# Patient Record
Sex: Female | Born: 1990 | Race: White | Hispanic: No | Marital: Single | Smoking: Never smoker
Health system: Southern US, Community
[De-identification: ages and names within clinical notes are randomized; demographics above are authoritative.]

## PROBLEM LIST (undated history)

## (undated) DIAGNOSIS — R55 Syncope and collapse: Secondary | ICD-10-CM

## (undated) DIAGNOSIS — J45909 Unspecified asthma, uncomplicated: Secondary | ICD-10-CM

## (undated) DIAGNOSIS — F32A Depression, unspecified: Secondary | ICD-10-CM

## (undated) DIAGNOSIS — F329 Major depressive disorder, single episode, unspecified: Secondary | ICD-10-CM

## (undated) HISTORY — PX: WISDOM TOOTH EXTRACTION: SHX21

---
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
## 2012-01-04 ENCOUNTER — Ambulatory Visit: Payer: Self-pay | Admitting: Pediatrics

## 2012-09-15 ENCOUNTER — Ambulatory Visit: Payer: Self-pay

## 2013-11-15 ENCOUNTER — Ambulatory Visit: Payer: Self-pay | Admitting: Physician Assistant

## 2014-10-09 ENCOUNTER — Encounter: Payer: Self-pay | Admitting: Emergency Medicine

## 2014-10-09 ENCOUNTER — Ambulatory Visit
Admission: EM | Admit: 2014-10-09 | Discharge: 2014-10-09 | Disposition: A | Discharge status: Home / Self Care | Payer: 59 | Attending: Family Medicine | Admitting: Family Medicine

## 2014-10-09 ENCOUNTER — Ambulatory Visit: Payer: 59

## 2014-10-09 DIAGNOSIS — J45909 Unspecified asthma, uncomplicated: Secondary | ICD-10-CM | POA: Insufficient documentation

## 2014-10-09 DIAGNOSIS — M25571 Pain in right ankle and joints of right foot: Secondary | ICD-10-CM | POA: Diagnosis present

## 2014-10-09 DIAGNOSIS — F329 Major depressive disorder, single episode, unspecified: Secondary | ICD-10-CM | POA: Diagnosis not present

## 2014-10-09 DIAGNOSIS — W19XXXA Unspecified fall, initial encounter: Secondary | ICD-10-CM | POA: Diagnosis not present

## 2014-10-09 DIAGNOSIS — S93401A Sprain of unspecified ligament of right ankle, initial encounter: Secondary | ICD-10-CM | POA: Insufficient documentation

## 2014-10-09 HISTORY — DX: Major depressive disorder, single episode, unspecified: F32.9

## 2014-10-09 HISTORY — DX: Unspecified asthma, uncomplicated: J45.909

## 2014-10-09 HISTORY — DX: Depression, unspecified: F32.A

## 2014-10-09 HISTORY — DX: Syncope and collapse: R55

## 2014-10-09 MED ORDER — ACETAMINOPHEN-CODEINE #3 300-30 MG PO TABS: 1.0000 | ORAL_TABLET | Freq: Four times a day (QID) | ORAL | Status: AC | PRN

## 2014-10-09 NOTE — ED Provider Notes (Signed)
CSN: [id number]     Arrival date & time 10/09/14  0945 History   None    Chief Complaint  Patient presents with  . Ankle Pain   (Consider location/radiation/quality/duration/timing/severity/associated sxs/prior Treatment) HPI Comments: 24 yo female fell yesterday twisting, injuring her right ankle. Complains of pain and swelling. Denies numbness/tingling.  Otherwise healthy.   The history is provided by the patient.    Past Medical History  Diagnosis Date  . Syncope   . Asthma   . Mental depression    Past Surgical History  Procedure Laterality Date  . Wisdom tooth extraction     History reviewed. No pertinent family history. Social History  Substance Use Topics  . Smoking status: Never Smoker   . Smokeless tobacco: None  . Alcohol Use: 0.6 oz/week    1 Glasses of wine per week   OB History    No data available     Review of Systems  Allergies  Review of patient's allergies indicates no known allergies.  Home Medications   Prior to Admission medications   Medication Sig Start Date End Date Taking? Authorizing Provider  albuterol (PROVENTIL) (2.5 MG/3ML) 0.083% nebulizer solution Take 2.5 mg by nebulization every 6 (six) hours as needed for wheezing or shortness of breath.   Yes Historical Provider, MD  etonogestrel (NEXPLANON) 68 MG IMPL implant 1 each by Subdermal route once.   Yes Historical Provider, MD  lamoTRIgine (LAMICTAL) 25 MG tablet Take 50 mg by mouth daily.   Yes Historical Provider, MD  acetaminophen-codeine (TYLENOL #3) 300-30 MG per tablet Take 1-2 tablets by mouth every 6 (six) hours as needed for moderate pain. 10/09/14   Payton Mccallum, MD   BP 135/100 mmHg  Pulse 87  Temp(Src) 98.3 F (36.8 C) (Tympanic)  Resp 16  Ht  (1.575 m)  Wt 125 lb (56.7 kg)  BMI 22.86 kg/m2  SpO2 99% Physical Exam  Constitutional: She appears well-developed and well-nourished. No distress.  Musculoskeletal:       Right ankle: She exhibits swelling. She  exhibits normal range of motion, no ecchymosis, no deformity, no laceration and normal pulse. Tenderness. Lateral malleolus and medial malleolus tenderness found.  Neurological: She is alert.  Skin: She is not diaphoretic.  Nursing note and vitals reviewed.   ED Course  Procedures (including critical care time) Labs Review Labs Reviewed - No data to display  Imaging Review Dg Ankle Complete Right  10/09/2014   CLINICAL DATA:  Pain and swelling of the right ankle after twisting injury  EXAM: RIGHT ANKLE - COMPLETE 3+ VIEW  COMPARISON:  None.  FINDINGS: The ankle joint appears normal. Alignment is normal. No fracture is seen.  IMPRESSION: Negative.   Electronically Signed   By: Dwyane Dee M.D.   On: 10/09/2014 11:16     MDM   1. Ankle sprain, right, initial encounter    Discharge Medication List as of 10/09/2014 11:42 AM    START taking these medications   Details  acetaminophen-codeine (TYLENOL #3) 300-30 MG per tablet Take 1-2 tablets by mouth every 6 (six) hours as needed for moderate pain., Starting 10/09/2014, Until Discontinued, Print      Plan: 1. x-ray results and diagnosis reviewed with patient 2. rx as per orders; risks, benefits, potential side effects reviewed with patient 3. Recommend supportive treatment with rest, ice, elevation 4. F/u prn if symptoms worsen or don't improve    Payton Mccallum, MD 10/10/14 [phone]

## 2014-10-09 NOTE — ED Notes (Signed)
Right ankle pain after twisting last pm

## 2016-11-11 IMAGING — CR DG ANKLE COMPLETE 3+V*R*
3 series · 3 of 3 positions shown · non-contrast
Comparison: None.

CLINICAL DATA: Pain and swelling of the right ankle after twisting
injury

EXAM:
RIGHT ANKLE - COMPLETE 3+ VIEW

[ankle ap]
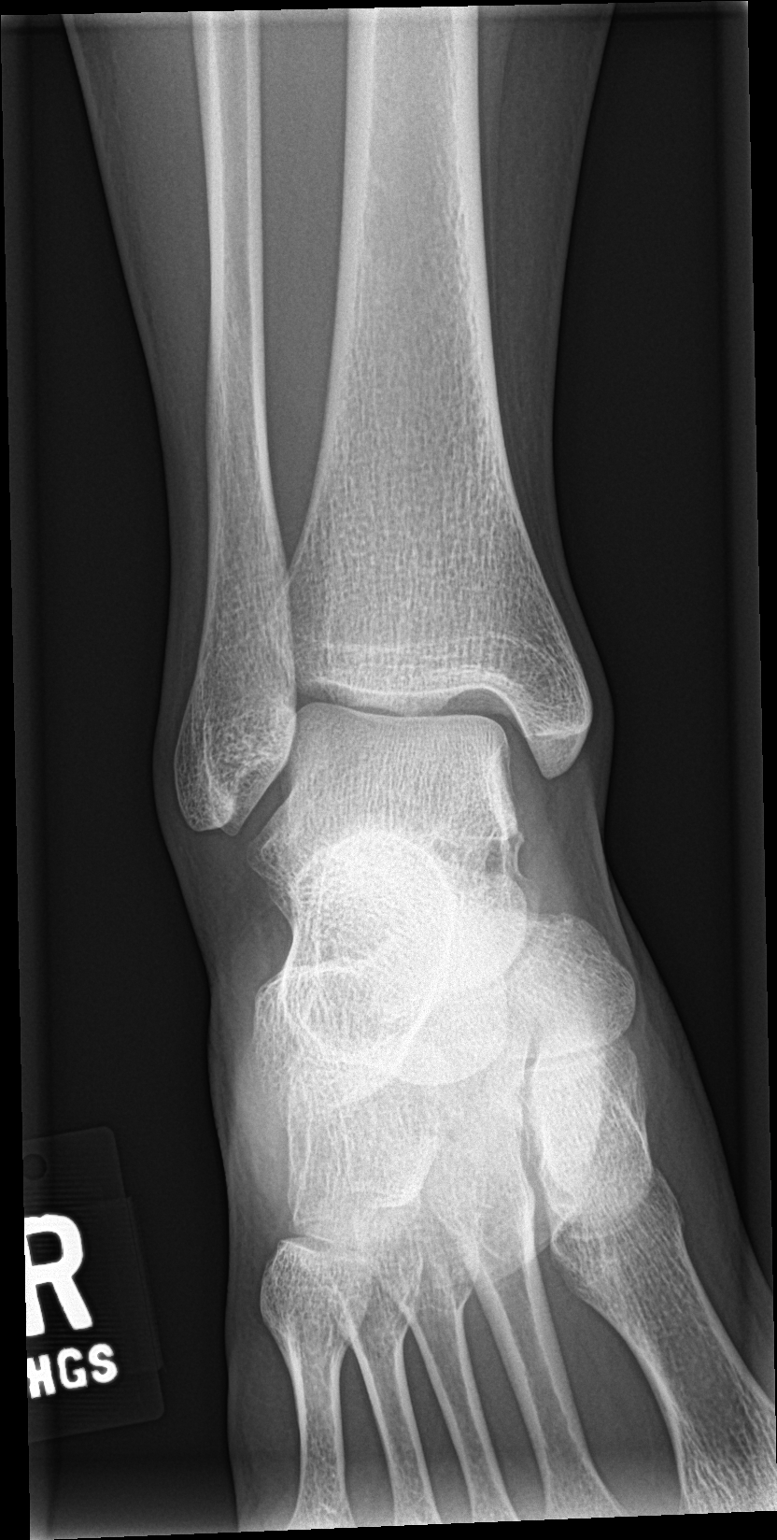

[ankle obl]
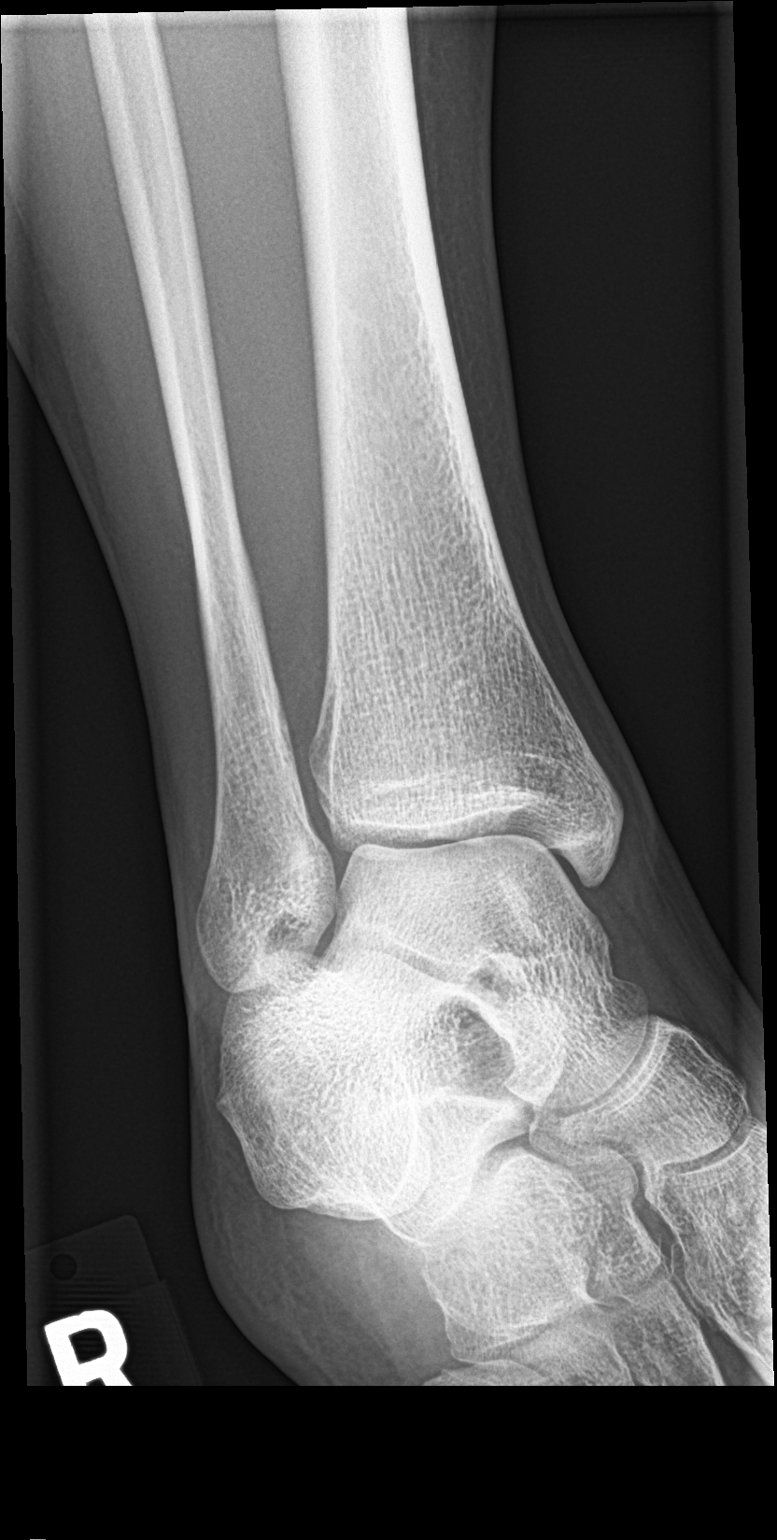

[ankle lat]
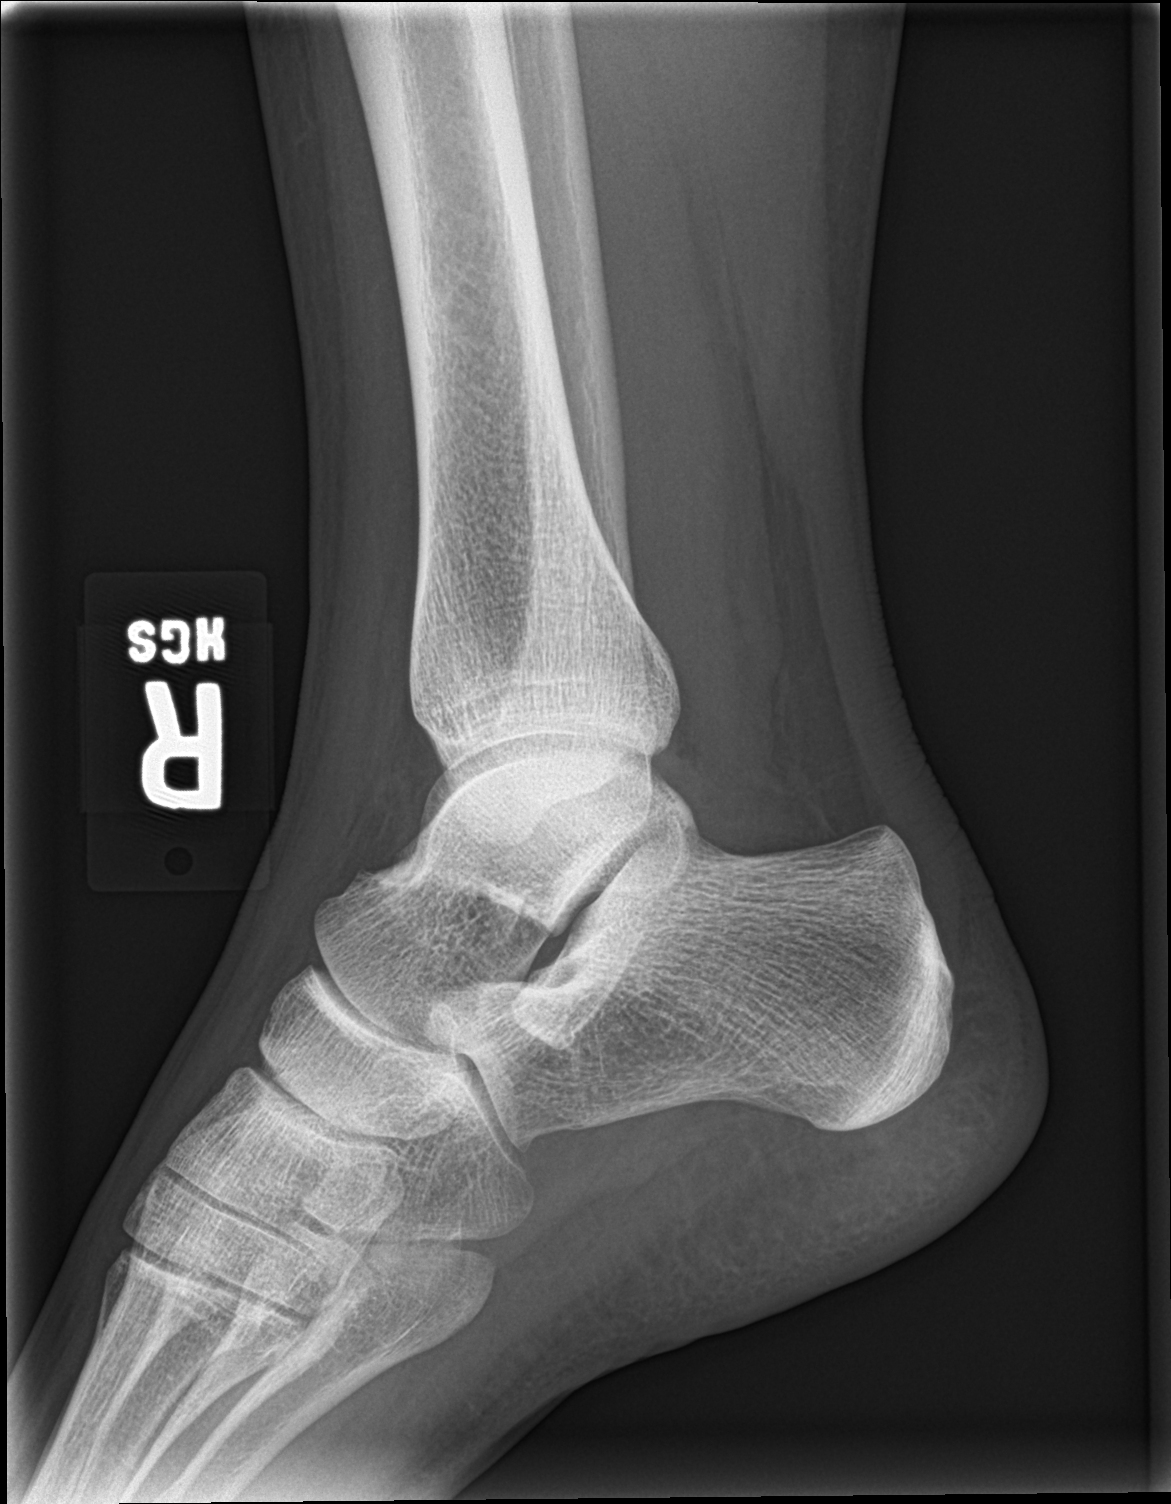

[3 of 3 positions shown; findings below may reference images not displayed]

FINDINGS: The ankle joint appears normal. Alignment is normal. No fracture is
seen.
IMPRESSION: Negative.

## 2023-04-20 ENCOUNTER — Ambulatory Visit
Admission: RE | Admit: 2023-04-20 | Discharge: 2023-04-20 | Disposition: A | Discharge status: Home / Self Care | Payer: Self-pay | Source: Ambulatory Visit | Attending: Family Medicine | Admitting: Family Medicine

## 2023-04-20 VITALS — BP 117/76 | HR 79 | Temp 99.1°F | Resp 16

## 2023-04-20 DIAGNOSIS — R051 Acute cough: Secondary | ICD-10-CM | POA: Insufficient documentation

## 2023-04-20 DIAGNOSIS — R490 Dysphonia: Secondary | ICD-10-CM | POA: Insufficient documentation

## 2023-04-20 DIAGNOSIS — J029 Acute pharyngitis, unspecified: Secondary | ICD-10-CM | POA: Diagnosis present

## 2023-04-20 DIAGNOSIS — J069 Acute upper respiratory infection, unspecified: Secondary | ICD-10-CM | POA: Diagnosis not present

## 2023-04-20 LAB — RESP PANEL BY RT-PCR (FLU A&B, COVID) ARPGX2
Influenza A by PCR: NEGATIVE
Influenza B by PCR: NEGATIVE
SARS Coronavirus 2 by RT PCR: NEGATIVE

## 2023-04-20 LAB — GROUP A STREP BY PCR: Group A Strep by PCR: NOT DETECTED

## 2023-04-20 MED ORDER — PROMETHAZINE-DM 6.25-15 MG/5ML PO SYRP: 5.0000 mL | ORAL_SOLUTION | Freq: Four times a day (QID) | ORAL | 0 refills | Status: AC | PRN

## 2023-04-20 MED ORDER — PREDNISONE 20 MG PO TABS: 40.0000 mg | ORAL_TABLET | Freq: Every day | ORAL | 0 refills | Status: AC

## 2023-04-20 NOTE — Discharge Instructions (Signed)
 -  Negative strep, COVID and flu.  URI/COLD SYMPTOMS: Your exam today is consistent with a viral illness. Antibiotics are not indicated at this time. Use medications as directed, including cough syrup, nasal saline, and decongestants. Your symptoms should improve over the next few days and resolve within 7-10 days. Increase rest and fluids. F/u if symptoms worsen or predominate such as sore throat, ear pain, productive cough, shortness of breath, or if you develop high fevers or worsening fatigue over the next several days.

## 2023-04-20 NOTE — ED Triage Notes (Signed)
 Experiencing sore throat, cough, loss of voice, neck tenderness, and swollen lymph nodes. I experienced these same symptoms approximately 1 week ago, and they went away after a few days of rest and medication. All symptoms returned yesterday. - Entered by patient

## 2023-04-20 NOTE — ED Provider Notes (Signed)
 [facility]    CSN: [id number] Arrival date & time: 04/20/23  0906      History   Chief Complaint Chief Complaint  Patient presents with   Sore Throat    Experiencing sore throat, cough, loss of voice, neck tenderness, and swollen lymph nodes. I experienced these same symptoms approximately 1 week ago, and they went away after a few days of rest and medication. All symptoms returned yesterday. - Entered by patient   Cough   Hoarse    HPI Stephanie Woodard is a 33 y.o. female presenting for sore throat, cough, congestion, voice hoarseness and fatigue x 3-4 days. Reports similar symptoms last week from Monday to Thursday.  States she felt okay part of Thursday, Friday and Saturday and then became ill again with similar symptoms.  Denies fever, chills, sweats, sinus pain, ear pain, chest pain, wheezing or shortness of breath.  No abdominal pain, vomiting or diarrhea.  Reports feeling a little better than she did a couple days ago.  Patient states she became a little concerned because this is a second time symptoms came back in a short amount of time.  History of asthma.  Has not needed to use inhaler recently.  Has been taking DayQuil with some improvement in symptoms.  Has not had any medication today.  HPI  Past Medical History:  Diagnosis Date   Asthma    Mental depression    Syncope     There are no active problems to display for this patient.   Past Surgical History:  Procedure Laterality Date   WISDOM TOOTH EXTRACTION      OB History   No obstetric history on file.      Home Medications    Prior to Admission medications   Medication Sig Start Date End Date Taking? Authorizing Provider  methylphenidate 54 MG PO CR tablet Take 54 mg by mouth daily. 03/08/23  Yes [provider]  predniSONE (DELTASONE) 20 MG tablet Take 2 tablets (40 mg total) by mouth daily for 5 days. 04/20/23 04/25/23 Yes Shirlee Latch, PA-C   promethazine-dextromethorphan (PROMETHAZINE-DM) 6.25-15 MG/5ML syrup Take 5 mLs by mouth 4 (four) times daily as needed. 04/20/23  Yes Eusebio Friendly B, PA-C  WIXELA INHUB 100-50 MCG/ACT AEPB 1 puff 2 (two) times daily. 03/25/23  Yes [provider]  acetaminophen-codeine (TYLENOL #3) 300-30 MG per tablet Take 1-2 tablets by mouth every 6 (six) hours as needed for moderate pain. 10/09/14   Payton Mccallum, MD  albuterol (PROVENTIL) (2.5 MG/3ML) 0.083% nebulizer solution Take 2.5 mg by nebulization every 6 (six) hours as needed for wheezing or shortness of breath.    [provider]  cloNIDine (CATAPRES) 0.1 MG tablet Take 0.1 mg by mouth at bedtime.    [provider]  etonogestrel (NEXPLANON) 68 MG IMPL implant 1 each by Subdermal route once.    [provider]  lamoTRIgine (LAMICTAL) 25 MG tablet Take 50 mg by mouth daily.    [provider]    Family History No family history on file.  Social History Social History   Tobacco Use   Smoking status: Never   Smokeless tobacco: Never  Vaping Use   Vaping status: Never Used  Substance Use Topics   Alcohol use: Yes    Alcohol/week: 1.0 standard drink of alcohol    Types: 1 Glasses of wine per week   Drug use: Never     Allergies   Patient has no known allergies.  Review of Systems Review of Systems  Constitutional:  Positive for fatigue. Negative for chills, diaphoresis and fever.  HENT:  Positive for congestion, rhinorrhea, sore throat and voice change. Negative for ear pain, sinus pressure and sinus pain.   Respiratory:  Positive for cough. Negative for shortness of breath.   Gastrointestinal:  Negative for abdominal pain, nausea and vomiting.  Musculoskeletal:  Positive for neck pain. Negative for arthralgias and myalgias.  Skin:  Negative for rash.  Neurological:  Negative for weakness and headaches.  Hematological:  Positive for adenopathy.     Physical Exam Triage Vital  Signs ED Triage Vitals  Encounter Vitals Group     BP      Systolic BP Percentile      Diastolic BP Percentile      Pulse      Resp      Temp      Temp src      SpO2      Weight      Height      Head Circumference      Peak Flow      Pain Score      Pain Loc      Pain Education      Exclude from Growth Chart    No data found.  Updated Vital Signs BP 117/76 (BP Location: Right Arm)   Pulse 79   Temp 99.1 F (37.3 C) (Oral)   Resp 16   SpO2 97%   Physical Exam Vitals and nursing note reviewed.  Constitutional:      General: She is not in acute distress.    Appearance: Normal appearance. She is well-developed. She is not ill-appearing or toxic-appearing.     Comments: +voice hoarseness  HENT:     Head: Normocephalic and atraumatic.     Right Ear: Tympanic membrane, ear canal and external ear normal.     Left Ear: Tympanic membrane, ear canal and external ear normal.     Nose: Congestion present.     Mouth/Throat:     Mouth: Mucous membranes are moist.     Pharynx: Oropharynx is clear. Posterior oropharyngeal erythema present.  Eyes:     General: No scleral icterus.       Right eye: No discharge.        Left eye: No discharge.     Conjunctiva/sclera: Conjunctivae normal.  Cardiovascular:     Rate and Rhythm: Normal rate and regular rhythm.     Heart sounds: Normal heart sounds.  Pulmonary:     Effort: Pulmonary effort is normal. No respiratory distress.     Breath sounds: Normal breath sounds. No wheezing, rhonchi or rales.  Musculoskeletal:     Cervical back: Neck supple.  Skin:    General: Skin is dry.  Neurological:     General: No focal deficit present.     Mental Status: She is alert. Mental status is at baseline.     Motor: No weakness.     Gait: Gait normal.  Psychiatric:        Mood and Affect: Mood normal.        Behavior: Behavior normal.      UC Treatments / Results  Labs (all labs ordered are listed, but only abnormal results are  displayed) Labs Reviewed  GROUP A STREP BY PCR  RESP PANEL BY RT-PCR (FLU A&B, COVID) [id number]    EKG   Radiology No results found.  Procedures Procedures (including critical care time)  Medications Ordered in UC Medications - No data to display  Initial Impression / Assessment and Plan / UC Course  I have reviewed the triage vital signs and the nursing notes.  Pertinent labs & imaging results that were available during my care of the patient were reviewed by me and considered in my medical decision making (see chart for details).   33 y/o female presents for 3 to 4-day history of voice hoarseness, sore throat, cough, congestion, fatigue.  Reports similar symptoms for about 3 days last week and then felt okay for couple days before symptoms returned.  No associated fever, ear pain, sinus pain, chest pain, shortness of breath.  History of asthma but is not needed use inhaler.  Taking DayQuil.  Vitals are stable and normal and patient is overall well-appearing.  No acute distress.  On exam has a hoarse voice.  Also has nasal congestion, mild posterior pharyngeal erythema.  Chest clear.  Heart regular rate and rhythm.  Strep testing and respiratory panel obtained. All negative.   Viral URI. Reviewed results with patient. Sent prednisone and promethazine DM to pharmacy. Advised returning if fever, breathing issues or worsening symptoms.  Work note given.   Final Clinical Impressions(s) / UC Diagnoses   Final diagnoses:  Viral upper respiratory tract infection  Acute cough  Sore throat  Voice hoarseness     Discharge Instructions      -Negative strep, COVID and flu  URI/COLD SYMPTOMS: Your exam today is consistent with a viral illness. Antibiotics are not indicated at this time. Use medications as directed, including cough syrup, nasal saline, and decongestants. Your symptoms should improve over the next few days and resolve within 7-10 days. Increase rest and fluids. F/u if  symptoms worsen or predominate such as sore throat, ear pain, productive cough, shortness of breath, or if you develop high fevers or worsening fatigue over the next several days.       ED Prescriptions     Medication Sig Dispense Auth. Provider   predniSONE (DELTASONE) 20 MG tablet Take 2 tablets (40 mg total) by mouth daily for 5 days. 10 tablet Shirlee Latch, PA-C   promethazine-dextromethorphan (PROMETHAZINE-DM) 6.25-15 MG/5ML syrup Take 5 mLs by mouth 4 (four) times daily as needed. 118 mL Shirlee Latch, PA-C      PDMP not reviewed this encounter.   Shirlee Latch, PA-C 04/20/23 1024
